# Patient Record
Sex: Male | Born: 1965 | Race: White | Hispanic: No | Smoking: Current some day smoker
Health system: Southern US, Community
[De-identification: ages and names within clinical notes are randomized; demographics above are authoritative.]

## PROBLEM LIST (undated history)

## (undated) DIAGNOSIS — R001 Bradycardia, unspecified: Secondary | ICD-10-CM

## (undated) DIAGNOSIS — E079 Disorder of thyroid, unspecified: Secondary | ICD-10-CM

## (undated) DIAGNOSIS — E039 Hypothyroidism, unspecified: Secondary | ICD-10-CM

## (undated) HISTORY — PX: CHEST TUBE INSERTION: SHX231

---

## 2001-05-20 ENCOUNTER — Emergency Department (HOSPITAL_COMMUNITY): Admission: EM | Admit: 2001-05-20 | Discharge: 2001-05-20 | Payer: Self-pay | Admitting: *Deleted

## 2001-05-20 ENCOUNTER — Encounter: Payer: Self-pay | Admitting: Emergency Medicine

## 2001-07-13 ENCOUNTER — Encounter: Payer: Self-pay | Admitting: *Deleted

## 2001-07-13 ENCOUNTER — Inpatient Hospital Stay (HOSPITAL_COMMUNITY): Admission: EM | Admit: 2001-07-13 | Discharge: 2001-07-22 | Payer: Self-pay | Admitting: Emergency Medicine

## 2001-07-15 ENCOUNTER — Encounter: Payer: Self-pay | Admitting: General Surgery

## 2001-07-16 ENCOUNTER — Encounter: Payer: Self-pay | Admitting: General Surgery

## 2001-07-17 ENCOUNTER — Encounter: Payer: Self-pay | Admitting: Family Medicine

## 2001-07-18 ENCOUNTER — Encounter: Payer: Self-pay | Admitting: General Surgery

## 2001-07-19 ENCOUNTER — Encounter: Payer: Self-pay | Admitting: General Surgery

## 2001-07-20 ENCOUNTER — Encounter: Payer: Self-pay | Admitting: General Surgery

## 2001-07-22 ENCOUNTER — Encounter: Payer: Self-pay | Admitting: General Surgery

## 2001-08-05 ENCOUNTER — Encounter: Payer: Self-pay | Admitting: General Surgery

## 2001-08-05 ENCOUNTER — Ambulatory Visit (HOSPITAL_COMMUNITY): Admission: RE | Admit: 2001-08-05 | Discharge: 2001-08-05 | Payer: Self-pay | Admitting: General Surgery

## 2001-08-16 ENCOUNTER — Emergency Department (HOSPITAL_COMMUNITY): Admission: EM | Admit: 2001-08-16 | Discharge: 2001-08-16 | Payer: Self-pay | Admitting: Emergency Medicine

## 2001-08-16 ENCOUNTER — Encounter: Payer: Self-pay | Admitting: Emergency Medicine

## 2001-10-05 ENCOUNTER — Encounter: Payer: Self-pay | Admitting: Family Medicine

## 2001-10-05 ENCOUNTER — Encounter: Payer: Self-pay | Admitting: Surgery

## 2001-10-05 ENCOUNTER — Inpatient Hospital Stay (HOSPITAL_COMMUNITY): Admission: EM | Admit: 2001-10-05 | Discharge: 2001-10-10 | Payer: Self-pay | Admitting: Emergency Medicine

## 2001-10-06 ENCOUNTER — Encounter: Payer: Self-pay | Admitting: Surgery

## 2001-10-07 ENCOUNTER — Encounter: Payer: Self-pay | Admitting: Surgery

## 2001-10-08 ENCOUNTER — Encounter: Payer: Self-pay | Admitting: Surgery

## 2001-10-09 ENCOUNTER — Encounter: Payer: Self-pay | Admitting: Surgery

## 2001-10-10 ENCOUNTER — Encounter: Payer: Self-pay | Admitting: Surgery

## 2001-10-27 ENCOUNTER — Encounter: Payer: Self-pay | Admitting: Surgery

## 2001-10-27 ENCOUNTER — Encounter: Admission: RE | Admit: 2001-10-27 | Discharge: 2001-10-27 | Payer: Self-pay | Admitting: Surgery

## 2001-12-19 ENCOUNTER — Encounter: Payer: Self-pay | Admitting: Emergency Medicine

## 2001-12-20 ENCOUNTER — Inpatient Hospital Stay (HOSPITAL_COMMUNITY): Admission: EM | Admit: 2001-12-20 | Discharge: 2001-12-30 | Payer: Self-pay

## 2001-12-20 ENCOUNTER — Encounter: Payer: Self-pay | Admitting: Surgery

## 2001-12-20 ENCOUNTER — Encounter: Payer: Self-pay | Admitting: Emergency Medicine

## 2001-12-21 ENCOUNTER — Encounter: Payer: Self-pay | Admitting: Surgery

## 2001-12-23 ENCOUNTER — Encounter: Payer: Self-pay | Admitting: Surgery

## 2001-12-24 ENCOUNTER — Encounter: Payer: Self-pay | Admitting: Surgery

## 2001-12-25 ENCOUNTER — Encounter: Payer: Self-pay | Admitting: Surgery

## 2001-12-26 ENCOUNTER — Encounter: Payer: Self-pay | Admitting: Cardiothoracic Surgery

## 2001-12-27 ENCOUNTER — Encounter: Payer: Self-pay | Admitting: Cardiothoracic Surgery

## 2001-12-29 ENCOUNTER — Encounter: Payer: Self-pay | Admitting: Surgery

## 2001-12-30 ENCOUNTER — Encounter: Payer: Self-pay | Admitting: Surgery

## 2001-12-30 ENCOUNTER — Encounter: Payer: Self-pay | Admitting: Thoracic Surgery (Cardiothoracic Vascular Surgery)

## 2002-01-12 ENCOUNTER — Encounter: Payer: Self-pay | Admitting: Surgery

## 2002-01-12 ENCOUNTER — Encounter: Admission: RE | Admit: 2002-01-12 | Discharge: 2002-01-12 | Payer: Self-pay | Admitting: Surgery

## 2006-10-17 ENCOUNTER — Emergency Department (HOSPITAL_COMMUNITY): Admission: EM | Admit: 2006-10-17 | Discharge: 2006-10-18 | Payer: Self-pay | Admitting: Emergency Medicine

## 2012-05-17 ENCOUNTER — Encounter (HOSPITAL_COMMUNITY): Payer: Self-pay | Admitting: *Deleted

## 2012-05-17 ENCOUNTER — Emergency Department (HOSPITAL_COMMUNITY)
Admission: EM | Admit: 2012-05-17 | Discharge: 2012-05-17 | Disposition: A | Discharge status: Home / Self Care | Payer: BC Managed Care – PPO | Attending: Emergency Medicine | Admitting: Emergency Medicine

## 2012-05-17 ENCOUNTER — Other Ambulatory Visit: Payer: Self-pay

## 2012-05-17 DIAGNOSIS — E876 Hypokalemia: Secondary | ICD-10-CM | POA: Insufficient documentation

## 2012-05-17 DIAGNOSIS — Z8679 Personal history of other diseases of the circulatory system: Secondary | ICD-10-CM | POA: Insufficient documentation

## 2012-05-17 DIAGNOSIS — Z87891 Personal history of nicotine dependence: Secondary | ICD-10-CM | POA: Insufficient documentation

## 2012-05-17 DIAGNOSIS — F1210 Cannabis abuse, uncomplicated: Secondary | ICD-10-CM | POA: Insufficient documentation

## 2012-05-17 DIAGNOSIS — R55 Syncope and collapse: Secondary | ICD-10-CM | POA: Insufficient documentation

## 2012-05-17 DIAGNOSIS — E860 Dehydration: Secondary | ICD-10-CM | POA: Insufficient documentation

## 2012-05-17 DIAGNOSIS — Z79899 Other long term (current) drug therapy: Secondary | ICD-10-CM | POA: Insufficient documentation

## 2012-05-17 DIAGNOSIS — E079 Disorder of thyroid, unspecified: Secondary | ICD-10-CM | POA: Insufficient documentation

## 2012-05-17 DIAGNOSIS — R824 Acetonuria: Secondary | ICD-10-CM | POA: Insufficient documentation

## 2012-05-17 DIAGNOSIS — I951 Orthostatic hypotension: Secondary | ICD-10-CM

## 2012-05-17 HISTORY — DX: Disorder of thyroid, unspecified: E07.9

## 2012-05-17 HISTORY — DX: Bradycardia, unspecified: R00.1

## 2012-05-17 LAB — URINALYSIS, ROUTINE W REFLEX MICROSCOPIC
Glucose, UA: NEGATIVE mg/dL
Hgb urine dipstick: NEGATIVE
Ketones, ur: NEGATIVE mg/dL
Leukocytes, UA: NEGATIVE
Protein, ur: NEGATIVE mg/dL
pH: 6 (ref 5.0–8.0)

## 2012-05-17 LAB — COMPREHENSIVE METABOLIC PANEL
ALT: 14 U/L (ref 0–53)
AST: 15 U/L (ref 0–37)
Albumin: 3.5 g/dL (ref 3.5–5.2)
Alkaline Phosphatase: 79 U/L (ref 39–117)
Chloride: 104 mEq/L (ref 96–112)
Potassium: 3.3 mEq/L — ABNORMAL LOW (ref 3.5–5.1)
Sodium: 139 mEq/L (ref 135–145)
Total Bilirubin: 0.2 mg/dL — ABNORMAL LOW (ref 0.3–1.2)
Total Protein: 6.6 g/dL (ref 6.0–8.3)

## 2012-05-17 LAB — CBC WITH DIFFERENTIAL/PLATELET
Basophils Absolute: 0 10*3/uL (ref 0.0–0.1)
Basophils Relative: 0 % (ref 0–1)
Eosinophils Absolute: 0.1 10*3/uL (ref 0.0–0.7)
Hemoglobin: 15.4 g/dL (ref 13.0–17.0)
MCH: 32.6 pg (ref 26.0–34.0)
MCHC: 35.2 g/dL (ref 30.0–36.0)
Monocytes Relative: 5 % (ref 3–12)
Neutro Abs: 8.6 10*3/uL — ABNORMAL HIGH (ref 1.7–7.7)
Neutrophils Relative %: 61 % (ref 43–77)
Platelets: 200 10*3/uL (ref 150–400)
RDW: 12.9 % (ref 11.5–15.5)

## 2012-05-17 MED ORDER — SODIUM CHLORIDE 0.9 % IV BOLUS (SEPSIS)
1000.0000 mL | Freq: Once | INTRAVENOUS | Status: AC
  Administered 2012-05-17: 1000 mL via INTRAVENOUS

## 2012-05-17 MED ORDER — POTASSIUM CHLORIDE ER 10 MEQ PO TBCR: 10.0000 meq | EXTENDED_RELEASE_TABLET | Freq: Two times a day (BID) | ORAL | Status: DC

## 2012-05-17 MED ORDER — ONDANSETRON 4 MG PO TBDP: 4.0000 mg | ORAL_TABLET | Freq: Three times a day (TID) | ORAL | Status: DC | PRN

## 2012-05-17 NOTE — ED Notes (Signed)
Pt states he was using the restroom at home standing to urinate and passed out. Pt states he had no pain or other sx prior to passing out but was found on the floor by his wife.

## 2012-05-17 NOTE — ED Provider Notes (Addendum)
History     CSN: [id number]  Arrival date & time 05/17/12  0048   First MD Initiated Contact with Patient 05/17/12 0106      Chief Complaint  Patient presents with  . Loss of Consciousness    (Consider location/radiation/quality/duration/timing/severity/associated sxs/prior treatment) HPI Comments: Pt presents with syncope - states that he was in the bathroom urinating and the next thing he knows he was on the floor looking up at his spouse who found him passed out on the bathroom floor.  He does not remember passing out, he states that he does remember feeling nauseated prior to this happening. He denies any prodromal palpitations, chest pain, shortness of breath, headache, blurred vision, lightheadedness, swelling, rashes. He has not had diarrhea, rectal bleeding, fevers or chills. He states that he does have thyroid disease for which he takes a medication but does not have any history of cardiac disease, pulmonary disease or neurogenic disease.  Patient is a 46 y.o. male presenting with syncope. The history is provided by the patient and a relative.  Loss of Consciousness    Past Medical History  Diagnosis Date  . Thyroid disease   . Bradycardia     Past Surgical History  Procedure Date  . Chest tube insertion     History reviewed. No pertinent family history.  History  Substance Use Topics  . Smoking status: Former Games developer  . Smokeless tobacco: Not on file  . Alcohol Use: No      Review of Systems  Cardiovascular: Positive for syncope.  All other systems reviewed and are negative.    Allergies  Review of patient's allergies indicates no known allergies.  Home Medications   Current Outpatient Rx  Name  Route  Sig  Dispense  Refill  . LEVOTHYROXINE SODIUM 125 MCG PO TABS   Oral   Take 125 mcg by mouth daily.         Marland Kitchen ONDANSETRON 4 MG PO TBDP   Oral   Take 1 tablet (4 mg total) by mouth every 8 (eight) hours as needed for nausea.   10 tablet   0     . POTASSIUM CHLORIDE ER 10 MEQ PO TBCR   Oral   Take 1 tablet (10 mEq total) by mouth 2 (two) times daily.   10 tablet   0     BP 111/54  Pulse 61  Temp 98.3 F (36.8 C) (Oral)  Resp 16  Ht 5\' 10"  (1.778 m)  Wt 140 lb (63.504 kg)  BMI 20.09 kg/m2  SpO2 93%  Physical Exam  Nursing note and vitals reviewed. Constitutional: He appears well-developed and well-nourished. No distress.  HENT:  Head: Normocephalic and atraumatic.  Mouth/Throat: Oropharynx is clear and moist. No oropharyngeal exudate.  Eyes: Conjunctivae normal and EOM are normal. Pupils are equal, round, and reactive to light. Right eye exhibits no discharge. Left eye exhibits no discharge. No scleral icterus.  Neck: Normal range of motion. Neck supple. No JVD present. No thyromegaly present.  Cardiovascular: Normal rate, regular rhythm, normal heart sounds and intact distal pulses.  Exam reveals no gallop and no friction rub.   No murmur heard. Pulmonary/Chest: Effort normal and breath sounds normal. No respiratory distress. He has no wheezes. He has no rales.  Abdominal: Soft. Bowel sounds are normal. He exhibits no distension and no mass. There is no tenderness.  Musculoskeletal: Normal range of motion. He exhibits no edema and no tenderness.  Lymphadenopathy:    He has no cervical  adenopathy.  Neurological: He is alert. Coordination normal.       Neurologic exam:  Speech clear, pupils equal round reactive to light, extraocular movements intact  Normal peripheral visual fields Cranial nerves III through XII normal including no facial droop Follows commands, moves all extremities x4, normal strength to bilateral upper and lower extremities at all major muscle groups including grip Sensation normal to light touch and pinprick Coordination intact, no limb ataxia, finger-nose-finger normal Rapid alternating movements normal No pronator drift Gait normal   Skin: Skin is warm and dry. No rash noted. No erythema.   Psychiatric: He has a normal mood and affect. His behavior is normal.    ED Course  Procedures (including critical care time)  Labs Reviewed  CBC WITH DIFFERENTIAL - Abnormal; Notable for the following:    WBC 14.3 (*)     Neutro Abs 8.6 (*)     Lymphs Abs 4.8 (*)     All other components within normal limits  COMPREHENSIVE METABOLIC PANEL - Abnormal; Notable for the following:    Potassium 3.3 (*)     Total Bilirubin 0.2 (*)     GFR calc non Af Amer 81 (*)     All other components within normal limits  URINALYSIS, ROUTINE W REFLEX MICROSCOPIC - Abnormal; Notable for the following:    Specific Gravity, Urine >1.030 (*)     All other components within normal limits  TROPONIN I   No results found.   1. Orthostatic syncope   2. Hypokalemia   3. Dehydration       MDM  Mental status is normal - neuro and cardiac exams are unremarkable, ? Micturition syncope, orthostatics.  Exam reassuring.   The patient has been reevaluated, he has significant lightheadedness when he stands up from a sitting or laying position. He does have a slight drop in his blood pressure and a slight raising his heart rate when he sits up. He has mild ketonuria and mild hypokalemia. I suspect that the majority of the symptoms are from relative dehydration, I have asked him to followup with his family Dr. For further testing regarding his mild sinus bradycardia and have given you potassium supplementation for home. Fluids and encouraged, IV fluid bolus has been given in emergency department, the patient appears stable for discharge   ED ECG REPORT  I personally interpreted this EKG   Date: 05/17/2012   Rate: 50  Rhythm: sinus bradycardia  QRS Axis: normal  Intervals: QRS slightly prolonged  ST/T Wave abnormalities: normal  Conduction Disutrbances:none  Narrative Interpretation:   Old EKG Reviewed: none available    Vida Roller, MD 05/17/12 [id number]  Vida Roller, MD 05/17/12 0700

## 2012-05-17 NOTE — ED Notes (Signed)
Pt discharged. Pt stable at time of discharge. Medications reviewed pt has no questions regarding discharge at this time. Pt voiced understanding of discharge instructions.  

## 2014-07-05 ENCOUNTER — Other Ambulatory Visit (HOSPITAL_COMMUNITY): Payer: Self-pay | Admitting: Physician Assistant

## 2014-07-05 DIAGNOSIS — R519 Headache, unspecified: Secondary | ICD-10-CM

## 2014-07-05 DIAGNOSIS — R51 Headache: Principal | ICD-10-CM

## 2014-07-08 ENCOUNTER — Ambulatory Visit (HOSPITAL_COMMUNITY): Payer: BLUE CROSS/BLUE SHIELD

## 2014-07-14 ENCOUNTER — Ambulatory Visit (HOSPITAL_COMMUNITY)
Admission: RE | Admit: 2014-07-14 | Discharge: 2014-07-14 | Disposition: A | Discharge status: Home / Self Care | Payer: BLUE CROSS/BLUE SHIELD | Source: Ambulatory Visit | Attending: Physician Assistant | Admitting: Physician Assistant

## 2014-07-14 ENCOUNTER — Other Ambulatory Visit (HOSPITAL_COMMUNITY): Payer: Self-pay | Admitting: Physician Assistant

## 2014-07-14 DIAGNOSIS — R51 Headache: Secondary | ICD-10-CM | POA: Diagnosis not present

## 2014-07-14 DIAGNOSIS — Z01818 Encounter for other preprocedural examination: Secondary | ICD-10-CM

## 2014-07-14 DIAGNOSIS — R519 Headache, unspecified: Secondary | ICD-10-CM

## 2015-12-27 ENCOUNTER — Encounter (INDEPENDENT_AMBULATORY_CARE_PROVIDER_SITE_OTHER): Payer: Self-pay | Admitting: *Deleted

## 2016-11-03 ENCOUNTER — Emergency Department (HOSPITAL_COMMUNITY): Payer: PRIVATE HEALTH INSURANCE

## 2016-11-03 ENCOUNTER — Emergency Department (HOSPITAL_COMMUNITY)
Admission: EM | Admit: 2016-11-03 | Discharge: 2016-11-04 | Disposition: A | Discharge status: Home / Self Care | Payer: PRIVATE HEALTH INSURANCE | Attending: Emergency Medicine | Admitting: Emergency Medicine

## 2016-11-03 DIAGNOSIS — Z87891 Personal history of nicotine dependence: Secondary | ICD-10-CM | POA: Insufficient documentation

## 2016-11-03 DIAGNOSIS — R911 Solitary pulmonary nodule: Secondary | ICD-10-CM | POA: Diagnosis not present

## 2016-11-03 DIAGNOSIS — Z79899 Other long term (current) drug therapy: Secondary | ICD-10-CM | POA: Insufficient documentation

## 2016-11-03 DIAGNOSIS — N23 Unspecified renal colic: Secondary | ICD-10-CM | POA: Insufficient documentation

## 2016-11-03 DIAGNOSIS — E876 Hypokalemia: Secondary | ICD-10-CM | POA: Insufficient documentation

## 2016-11-03 DIAGNOSIS — R109 Unspecified abdominal pain: Secondary | ICD-10-CM | POA: Insufficient documentation

## 2016-11-03 DIAGNOSIS — R112 Nausea with vomiting, unspecified: Secondary | ICD-10-CM | POA: Diagnosis present

## 2016-11-03 DIAGNOSIS — N50811 Right testicular pain: Secondary | ICD-10-CM | POA: Insufficient documentation

## 2016-11-03 LAB — CBC WITH DIFFERENTIAL/PLATELET
BASOS PCT: 0 %
Basophils Absolute: 0 10*3/uL (ref 0.0–0.1)
EOS ABS: 0.2 10*3/uL (ref 0.0–0.7)
Eosinophils Relative: 1 %
HCT: 45.8 % (ref 39.0–52.0)
HEMOGLOBIN: 15.7 g/dL (ref 13.0–17.0)
Lymphocytes Relative: 25 %
Lymphs Abs: 3.5 10*3/uL (ref 0.7–4.0)
MCH: 31.2 pg (ref 26.0–34.0)
MCHC: 34.3 g/dL (ref 30.0–36.0)
MCV: 91.1 fL (ref 78.0–100.0)
MONOS PCT: 5 %
Monocytes Absolute: 0.7 10*3/uL (ref 0.1–1.0)
NEUTROS PCT: 69 %
Neutro Abs: 9.5 10*3/uL — ABNORMAL HIGH (ref 1.7–7.7)
PLATELETS: 211 10*3/uL (ref 150–400)
RBC: 5.03 MIL/uL (ref 4.22–5.81)
RDW: 13.2 % (ref 11.5–15.5)
WBC: 13.8 10*3/uL — AB (ref 4.0–10.5)

## 2016-11-03 LAB — URINALYSIS, ROUTINE W REFLEX MICROSCOPIC
BILIRUBIN URINE: NEGATIVE
GLUCOSE, UA: NEGATIVE mg/dL
KETONES UR: NEGATIVE mg/dL
Leukocytes, UA: NEGATIVE
Nitrite: NEGATIVE
PH: 6 (ref 5.0–8.0)
Protein, ur: NEGATIVE mg/dL
SPECIFIC GRAVITY, URINE: 1.014 (ref 1.005–1.030)
SQUAMOUS EPITHELIAL / LPF: NONE SEEN

## 2016-11-03 LAB — BASIC METABOLIC PANEL
Anion gap: 10 (ref 5–15)
BUN: 22 mg/dL — ABNORMAL HIGH (ref 6–20)
CALCIUM: 9.4 mg/dL (ref 8.9–10.3)
CO2: 23 mmol/L (ref 22–32)
CREATININE: 1.28 mg/dL — AB (ref 0.61–1.24)
Chloride: 109 mmol/L (ref 101–111)
GFR calc non Af Amer: >60 mL/min (ref >60)
Glucose, Bld: 130 mg/dL — ABNORMAL HIGH (ref 65–99)
Potassium: 3.2 mmol/L — ABNORMAL LOW (ref 3.5–5.1)
SODIUM: 142 mmol/L (ref 135–145)

## 2016-11-03 MED ORDER — ONDANSETRON HCL 4 MG PO TABS: 4.0000 mg | ORAL_TABLET | Freq: Four times a day (QID) | ORAL | 0 refills | Status: DC | PRN

## 2016-11-03 MED ORDER — HYDROMORPHONE HCL 1 MG/ML IJ SOLN
1.0000 mg | Freq: Once | INTRAMUSCULAR | Status: AC
  Administered 2016-11-03: 1 mg via INTRAVENOUS
  Filled 2016-11-03: qty 1

## 2016-11-03 MED ORDER — OXYCODONE-ACETAMINOPHEN 5-325 MG PO TABS: 1.0000 | ORAL_TABLET | ORAL | 0 refills | Status: DC | PRN

## 2016-11-03 MED ORDER — KETOROLAC TROMETHAMINE 10 MG PO TABS: 10.0000 mg | ORAL_TABLET | Freq: Four times a day (QID) | ORAL | 0 refills | Status: DC | PRN

## 2016-11-03 MED ORDER — METOCLOPRAMIDE HCL 5 MG/ML IJ SOLN
10.0000 mg | Freq: Once | INTRAMUSCULAR | Status: AC
  Administered 2016-11-03: 10 mg via INTRAVENOUS
  Filled 2016-11-03: qty 2

## 2016-11-03 MED ORDER — POTASSIUM CHLORIDE CRYS ER 20 MEQ PO TBCR
40.0000 meq | EXTENDED_RELEASE_TABLET | Freq: Once | ORAL | Status: AC
Start: 2016-11-03 — End: 2016-11-03
  Administered 2016-11-03: 40 meq via ORAL
  Filled 2016-11-03: qty 2

## 2016-11-03 MED ORDER — POTASSIUM CHLORIDE CRYS ER 20 MEQ PO TBCR: 20.0000 meq | EXTENDED_RELEASE_TABLET | Freq: Every day | ORAL | 0 refills | Status: DC

## 2016-11-03 MED ORDER — TAMSULOSIN HCL 0.4 MG PO CAPS: 0.4000 mg | ORAL_CAPSULE | Freq: Every day | ORAL | 0 refills | Status: DC

## 2016-11-03 NOTE — ED Triage Notes (Signed)
Pt reports having pain in his right testicle on Thursday. Pt states it radiates up into his abdomen and right flank. Pt also reporting a headache with n/v.

## 2016-11-03 NOTE — ED Triage Notes (Signed)
Pt presents with right groin pain x 3 days, denies hx of kidney stones.  Also c/o HA today.

## 2016-11-03 NOTE — ED Provider Notes (Signed)
CT with evidence of 5 mm ureteral stone on the right. Hydronephrosis is present. Incidental findings of lung nodules   Patient is symptom free and resting. Abdominal exam is benign. Discuss CT results. The symptoms persist patient will need to follow-up with urology. Patient will also need to follow-up with his primary physician to arrange repeat CT scan in 6-12 months. Return precautions given.   Loren RacerYelverton, Fergie Sherbert, MD 11/03/16 [phone]

## 2016-11-03 NOTE — ED Provider Notes (Signed)
[facility]-EMERGENCY DEPT Provider Note   CSN: [id number] Arrival date & time: 11/03/16  2001     [id number]   Chief Complaint Chief Complaint  Patient presents with  . Testicle Pain    HPI Timothy Irwin is a 51 y.o. male.  HPI Complains of right testicular pain radiating to right groin and right flank onset 3 or 4 days ago, constant. He also complains of gradual onset diffuse headache which started approximately 2 hours ago accompanied by multiple episodes of vomiting. No fever. No treatment prior to coming here. Nothing makes symptoms better or worse. No other associated symptoms Past Medical History:  Diagnosis Date  . Bradycardia   . Thyroid disease     There are no active problems to display for this patient.   Past Surgical History:  Procedure Laterality Date  . CHEST TUBE INSERTION         Home Medications    Prior to Admission medications   Medication Sig Start Date End Date Taking? Authorizing Provider  levothyroxine (SYNTHROID, LEVOTHROID) 125 MCG tablet Take 125 mcg by mouth daily.    [provider]  ondansetron (ZOFRAN ODT) 4 MG disintegrating tablet Take 1 tablet (4 mg total) by mouth every 8 (eight) hours as needed for nausea. 05/17/12   Eber HongMiller, Brian, MD  potassium chloride (K-DUR) 10 MEQ tablet Take 1 tablet (10 mEq total) by mouth 2 (two) times daily. 05/17/12   Eber HongMiller, Brian, MD    Family History No family history on file.  Social History Social History  Substance Use Topics  . Smoking status: Former Games developermoker  . Smokeless tobacco: Not on file  . Alcohol use No   Cigar smoker no alcohol no drug use  Allergies   Patient has no known allergies.   Review of Systems Review of Systems  Constitutional: Negative.   HENT: Negative.   Respiratory: Negative.   Cardiovascular: Negative.   Gastrointestinal: Positive for nausea and vomiting.  Genitourinary: Positive for flank pain and testicular pain.  Skin: Negative.   Neurological: Positive  for headaches.  Psychiatric/Behavioral: Negative.   All other systems reviewed and are negative.    Physical Exam Updated Vital Signs BP (!) 142/64 (BP Location: Left Arm)   Pulse (!) 54   Temp 98.4 F (36.9 C) (Temporal)   Resp 16   Ht 5\' 9"  (1.753 m)   Wt 135 lb (61.2 kg)   SpO2 100%   BMI 19.94 kg/m   Physical Exam  Constitutional: He is oriented to person, place, and time. He appears well-developed and well-nourished. He appears distressed.  Appears uncomfortable  HENT:  Head: Normocephalic and atraumatic.  Eyes: Conjunctivae are normal. Pupils are equal, round, and reactive to light.  Neck: Neck supple. No tracheal deviation present. No thyromegaly present.  Cardiovascular: Normal rate and regular rhythm.   No murmur heard. Pulmonary/Chest: Effort normal and breath sounds normal.  Abdominal: Soft. Bowel sounds are normal. He exhibits no distension. There is no tenderness.  Genitourinary: Penis normal.  Genitourinary Comments: Scrotum normal. Testes normal lie.scrotum nontender No flank tenderness  Musculoskeletal: Normal range of motion. He exhibits no edema or tenderness.  Neurological: He is alert and oriented to person, place, and time. No cranial nerve deficit. Coordination normal.  Moves all extremities motor strength 5 over 5 overall all extremities well  Skin: Skin is warm and dry. No rash noted.  Psychiatric: He has a normal mood and affect.  Nursing note and vitals reviewed.    ED Treatments /  Results  Labs (all labs ordered are listed, but only abnormal results are displayed) Labs Reviewed - No data to display  EKG  EKG Interpretation None       Radiology No results found.  Procedures Procedures (including critical care time)  Medications Ordered in ED Medications - No data to display  Results for orders placed or performed during the hospital encounter of 11/03/16  Basic metabolic panel  Result Value Ref Range   Sodium 142 135 - 145  mmol/L   Potassium 3.2 (L) 3.5 - 5.1 mmol/L   Chloride 109 101 - 111 mmol/L   CO2 23 22 - 32 mmol/L   Glucose, Bld 130 (H) 65 - 99 mg/dL   BUN 22 (H) 6 - 20 mg/dL   Creatinine, Ser 8.11 (H) 0.61 - 1.24 mg/dL   Calcium 9.4 8.9 - 91.4 mg/dL   GFR calc non Af Amer >60 >60 mL/min   GFR calc Af Amer >60 >60 mL/min   Anion gap 10 5 - 15  CBC with Differential/Platelet  Result Value Ref Range   WBC 13.8 (H) 4.0 - 10.5 K/uL   RBC 5.03 4.22 - 5.81 MIL/uL   Hemoglobin 15.7 13.0 - 17.0 g/dL   HCT 78.2 95.6 - 21.3 %   MCV 91.1 78.0 - 100.0 fL   MCH 31.2 26.0 - 34.0 pg   MCHC 34.3 30.0 - 36.0 g/dL   RDW 08.6 57.8 - 46.9 %   Platelets 211 150 - 400 K/uL   Neutrophils Relative % 69 %   Neutro Abs 9.5 (H) 1.7 - 7.7 K/uL   Lymphocytes Relative 25 %   Lymphs Abs 3.5 0.7 - 4.0 K/uL   Monocytes Relative 5 %   Monocytes Absolute 0.7 0.1 - 1.0 K/uL   Eosinophils Relative 1 %   Eosinophils Absolute 0.2 0.0 - 0.7 K/uL   Basophils Relative 0 %   Basophils Absolute 0.0 0.0 - 0.1 K/uL   No results found. Initial Impression / Assessment and Plan / ED Course  I have reviewed the triage vital signs and the nursing notes.  Pertinent labs & imaging results that were available during my care of the patient were reviewed by me and considered in my medical decision making (see chart for details).     10 PM patient feels much improved after treatment with intravenous hydromorphone and Reglan. He is alert Glasgow Coma Score score 15 no distress Signed out to Dr. Ranae Palms 10 PM. Oral Potassium chloride ordered. Suspect ureteral colic Final Clinical Impressions(s) / ED Diagnoses  Diagnosis #1 right testicular pain  #2 right flank pain #3 headache #4 nausea and vomiting Final diagnoses:  None  #5 hypokalemia  New Prescriptions New Prescriptions   No medications on file     Doug Sou, MD 11/03/16 2205

## 2018-06-03 IMAGING — CT CT RENAL STONE PROTOCOL
2 of 4 series · 16 of 46 positions shown, 18 images · non-contrast
Comparison: None.

CLINICAL DATA: Right flank pain.

EXAM:
CT ABDOMEN AND PELVIS WITHOUT CONTRAST
TECHNIQUE: Multidetector CT imaging of the abdomen and pelvis was performed
following the standard protocol without IV contrast.

[Series 2: axial st · axial · 0.66mm/px · z∈[-64,+316]mm · 13 of 84 slices shown, 15 images]
[im 4/84  soft-tissue]
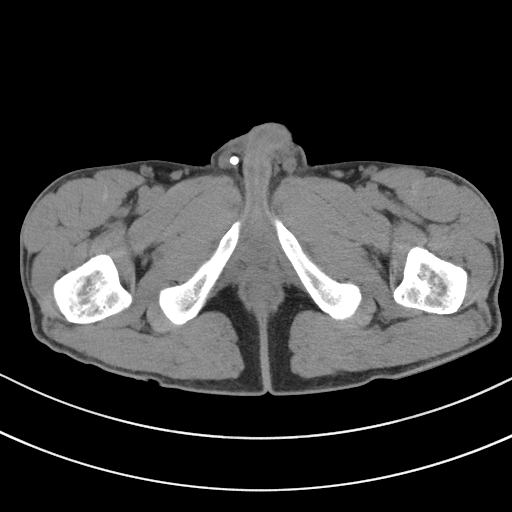
[im 4/84  bone]
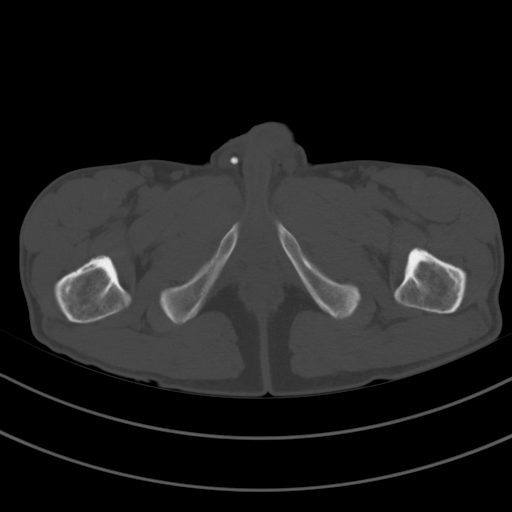
[im 11/84  soft-tissue]
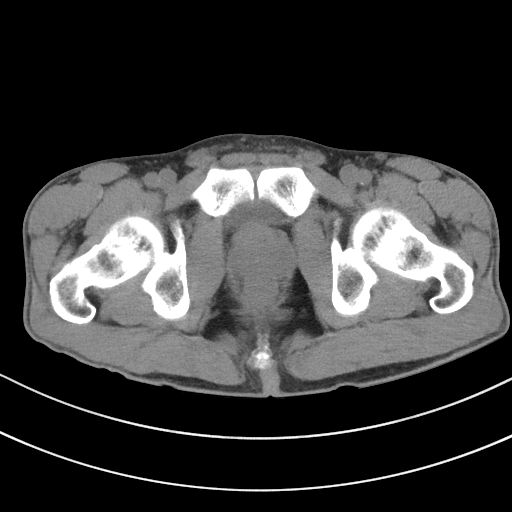
[im 18/84  soft-tissue]
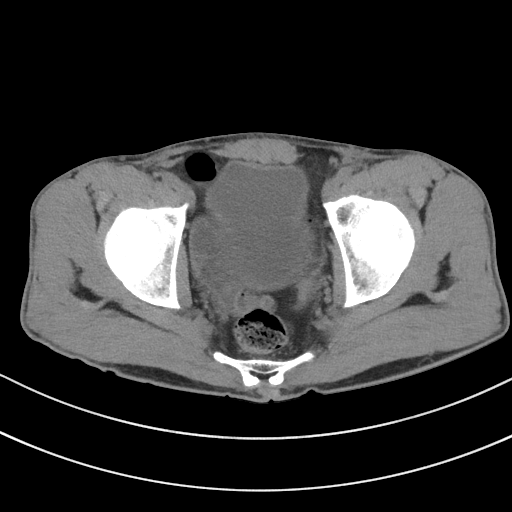
[im 25/84  soft-tissue]
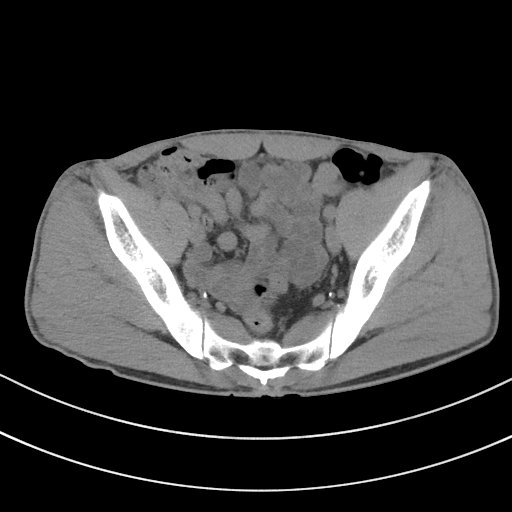
[im 28/84  soft-tissue]
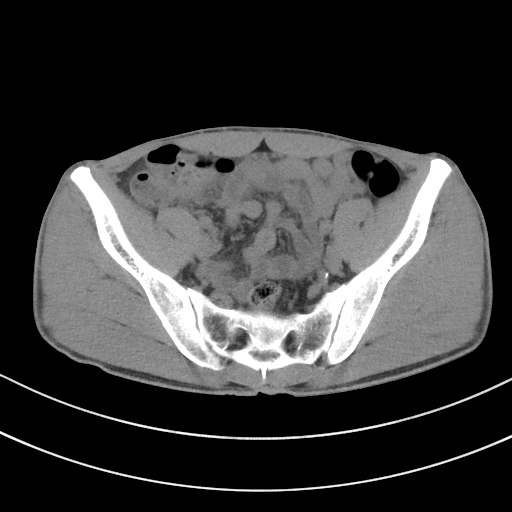
[im 35/84  soft-tissue]
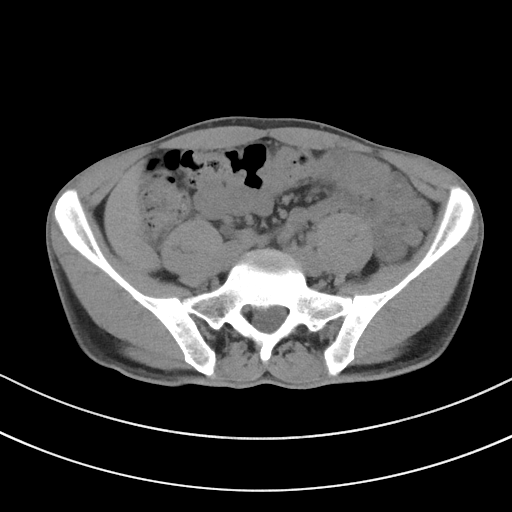
[im 42/84  soft-tissue]
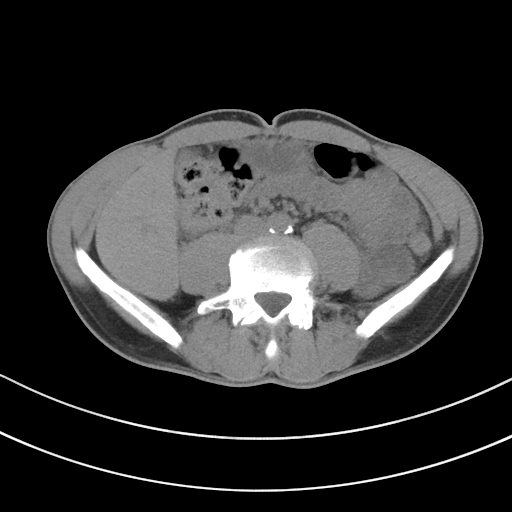
[im 49/84  soft-tissue]
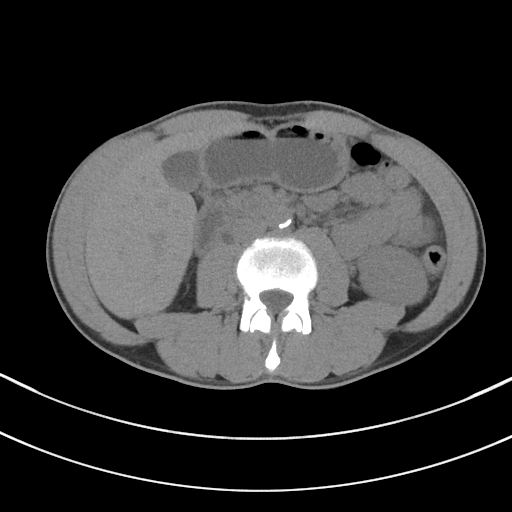
[im 56/84  soft-tissue]
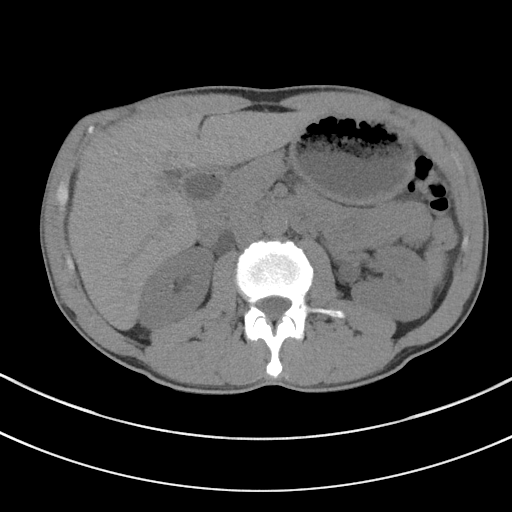
[im 56/84  bone]
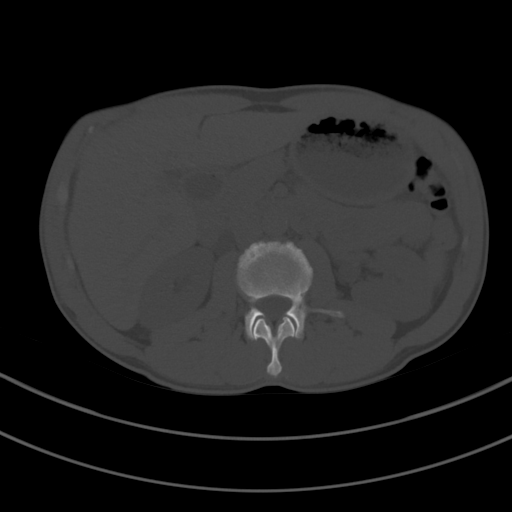
[im 59/84  soft-tissue]
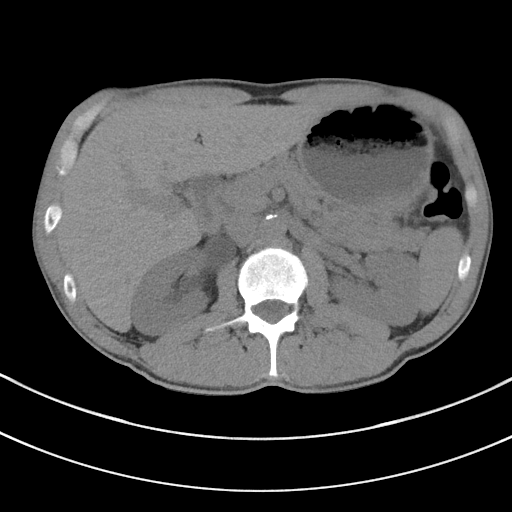
[im 66/84  soft-tissue]
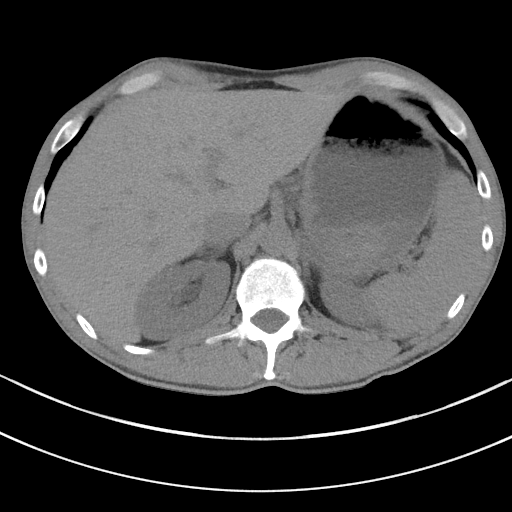
[im 73/84  soft-tissue]
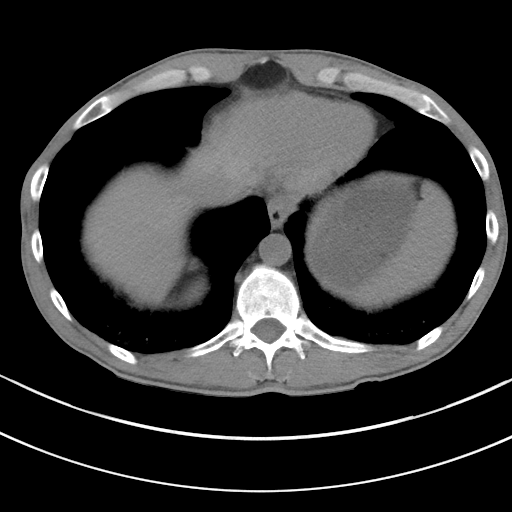
[im 80/84  soft-tissue]
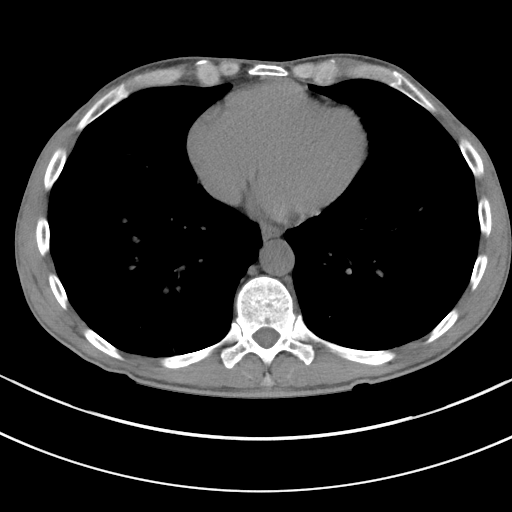

[Series 5: coronal st · coronal · 0.71mm/px · 3 of 84 slices shown]
[im 28/84  soft-tissue]
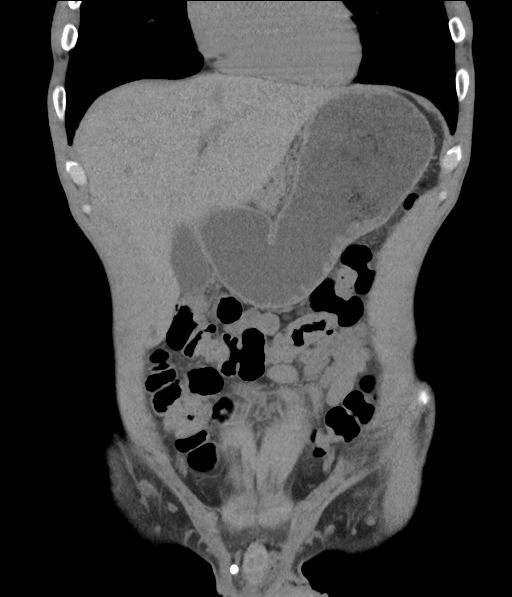
[im 37/84  soft-tissue]
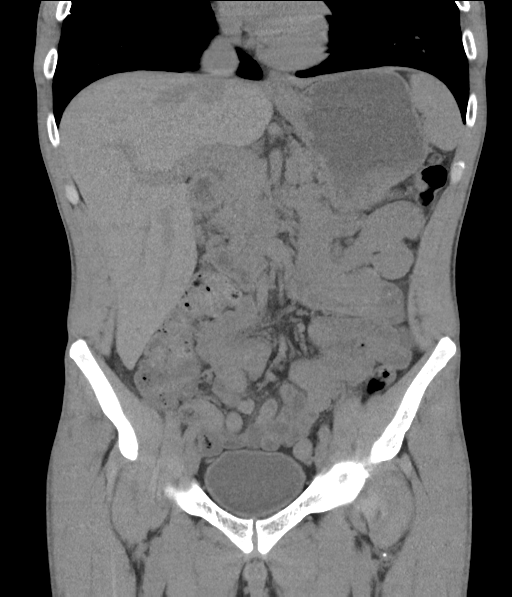
[im 47/84  soft-tissue]
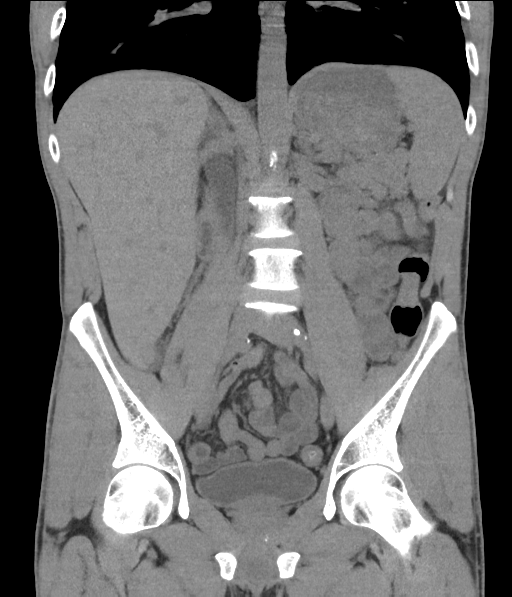

[16 of 46 positions shown; findings below may reference images not displayed]

FINDINGS: Lower chest: There is a 4 mm nodule in the lateral right lung base
on series 4, image 15. There is a 5.3 mm nodule on image 8
emphysematous changes are seen in the lung bases.

Hepatobiliary: No focal liver abnormality is seen. No gallstones,
gallbladder wall thickening, or biliary dilatation.

Pancreas: Unremarkable. No pancreatic ductal dilatation or
surrounding inflammatory changes.

Spleen: Normal in size without focal abnormality.

Adrenals/Urinary Tract: There is a cyst in the anterior left kidney.
No suspicious masses seen in the kidneys. No renal stones seen in
the kidneys. No hydronephrosis on the left. No left ureteral stones.
There is mild hydronephrosis on the right due to a 5 mm stone in the
mid right ureter. The bladder is normal.

Stomach/Bowel: The stomach and small bowel are normal. The colon is
normal in appearance as is the visualized appendix.

Vascular/Lymphatic: Atherosclerosis is seen in the non aneurysmal
aorta. No adenopathy.

Reproductive: Prostate is unremarkable.

Other: No abdominal wall hernia or abnormality. No abdominopelvic
ascites.

Musculoskeletal: No acute or significant osseous findings.
IMPRESSION: 1. 5 mm stone in the mid right ureter resulting in mild
hydronephrosis.
2. Two nodules in the lung bases with the largest on the left
measuring 5.3 mm. Non-contrast chest CT at 6-12 months is
recommended. If the nodule is stable at time of repeat CT, then
future CT at 18-24 months (from today's scan) is considered optional
for low-risk patients, but is recommended for high-risk patients.
This recommendation follows the consensus statement: Guidelines for
Management of Incidental Pulmonary Nodules Detected on CT Images:
3. Atherosclerosis.

## 2018-11-24 ENCOUNTER — Other Ambulatory Visit: Payer: Self-pay | Admitting: Family Medicine

## 2018-11-24 ENCOUNTER — Other Ambulatory Visit (HOSPITAL_COMMUNITY): Payer: Self-pay | Admitting: Family Medicine

## 2018-11-24 DIAGNOSIS — R911 Solitary pulmonary nodule: Secondary | ICD-10-CM

## 2021-03-26 ENCOUNTER — Encounter: Payer: Self-pay | Admitting: *Deleted

## 2021-03-29 ENCOUNTER — Encounter: Payer: Self-pay | Admitting: Urology

## 2021-08-27 ENCOUNTER — Encounter (INDEPENDENT_AMBULATORY_CARE_PROVIDER_SITE_OTHER): Payer: Self-pay | Admitting: *Deleted

## 2021-11-05 ENCOUNTER — Encounter (INDEPENDENT_AMBULATORY_CARE_PROVIDER_SITE_OTHER): Payer: Self-pay | Admitting: Gastroenterology

## 2021-11-05 ENCOUNTER — Ambulatory Visit (INDEPENDENT_AMBULATORY_CARE_PROVIDER_SITE_OTHER): Payer: PRIVATE HEALTH INSURANCE | Admitting: Gastroenterology

## 2021-11-05 VITALS — BP 102/67 | HR 63 | Temp 98.1°F | Ht 69.0 in | Wt 130.0 lb

## 2021-11-05 DIAGNOSIS — R1013 Epigastric pain: Secondary | ICD-10-CM | POA: Diagnosis not present

## 2021-11-05 NOTE — H&P (View-Only) (Signed)
 Referring Provider: Golding, John, MD Primary Care Physician:  Golding, John, MD Primary GI Physician: new  Chief Complaint  Patient presents with   Abdominal Pain    Upper mid abdominal pain for awhile. States he had ct scan and us. Had scans at [facility].    HPI:   Timothy Irwin is a 55 y.o. male with past medical history of thyroid disease  Patient presenting today as a new patient for epigastric pain.  US abd complete 02/26/21 without findings to explain symptoms, CT A/P w contrast on 04/04/21 with no abnormalities, left renal cyst.  He reports that he has some upper abdominal pain for about the past 6 months, denies precipitating or alleviating factors.  Denies any vomiting. Has some occasional nausea due to pain being so severe at times. May notice the pain 3-4 days in a row, sometimes can go a week without the pain. Pain is dull in nature. Denies any pain radiation into his back or around right or left side. States that pain is better when he sits down. Reports appetite is good and he eats quite a bit, usually anything he wants. Has lost approx 30 pounds over the past few years though he went through a divorce and the loss of a child. Denies constipation or diarrhea. He does report that when the urge hits him to defecate, he has to get to a restroom pretty quickly, this is not new for him. Usually has a BM every morning between 5 and 530am. Denies rectal bleeding or melena. Reports very occasional issues with acid reflux. Denies dysphagia or odynophagia. Does not drink alcohol or take any OTC meds. No dysphagia or odynophagia.   NSAID use: no otc meds  Social hx: no etoh, smokes cigars Fam hx: adopted, unsure of family hx   Last Colonoscopy:never  Last Endoscopy:never  Past Medical History:  Diagnosis Date   Bradycardia    Thyroid disease     Past Surgical History:  Procedure Laterality Date   CHEST TUBE INSERTION      Current Outpatient Medications  Medication Sig  Dispense Refill   levothyroxine (SYNTHROID, LEVOTHROID) 125 MCG tablet Take 125 mcg by mouth daily before breakfast.      No current facility-administered medications for this visit.    Allergies as of 11/05/2021   (No Known Allergies)    History reviewed. No pertinent family history.  Social History   Socioeconomic History   Marital status: Divorced    Spouse name: Not on file   Number of children: Not on file   Years of education: Not on file   Highest education level: Not on file  Occupational History   Not on file  Tobacco Use   Smoking status: Some Days    Types: Cigars    Passive exposure: Current   Smokeless tobacco: Never  Substance and Sexual Activity   Alcohol use: No   Drug use: Yes    Types: Marijuana   Sexual activity: Not on file  Other Topics Concern   Not on file  Social History Narrative   Not on file   Social Determinants of Health   Financial Resource Strain: Not on file  Food Insecurity: Not on file  Transportation Needs: Not on file  Physical Activity: Not on file  Stress: Not on file  Social Connections: Not on file   Review of systems General: negative for malaise, night sweats, fever, chills, weight loss Neck: Negative for lumps, goiter, pain and significant neck swelling   Resp: Negative for cough, wheezing, dyspnea at rest CV: Negative for chest pain, leg swelling, palpitations, orthopnea GI: denies melena, hematochezia, nausea, vomiting, diarrhea, constipation, dysphagia, odyonophagia, early satiety or unintentional weight loss. +epigastric pain MSK: Negative for joint pain or swelling, back pain, and muscle pain. Derm: Negative for itching or rash Psych: Denies depression, anxiety, memory loss, confusion. No homicidal or suicidal ideation.  Heme: Negative for prolonged bleeding, bruising easily, and swollen nodes. Endocrine: Negative for cold or heat intolerance, polyuria, polydipsia and goiter. Neuro: negative for tremor, gait  imbalance, syncope and seizures. The remainder of the review of systems is noncontributory.  Physical Exam: BP 102/67 (BP Location: Left Arm, Patient Position: Sitting, Cuff Size: Normal)   Pulse 63   Temp 98.1 F (36.7 C) (Oral)   Ht 5' 9" (1.753 m)   Wt 130 lb (59 kg)   BMI 19.20 kg/m  General:   Alert and oriented. No distress noted. Pleasant and cooperative.  Head:  Normocephalic and atraumatic. Eyes:  Conjuctiva clear without scleral icterus. Mouth:  Oral mucosa pink and moist. Good dentition. No lesions. Heart: Normal rate and rhythm, s1 and s2 heart sounds present.  Lungs: Clear lung sounds in all lobes. Respirations equal and unlabored. Abdomen:  +BS, soft, non-tender and non-distended. No rebound or guarding. No HSM or masses noted. Derm: No palmar erythema or jaundice Msk:  Symmetrical without gross deformities. Normal posture. Extremities:  Without edema. Neurologic:  Alert and  oriented x4 Psych:  Alert and cooperative. Normal mood and affect.  Invalid input(s): 6 MONTHS   ASSESSMENT: Timothy Irwin is a 55 y.o. male presenting today as a new patient for epigastric pain.  Epigastric pain x6 months without precipitating or alleviating factors. Denies nausea, vomiting, early satiety, changes in appetite or associated weight loss. No radiation of pain. Denies GERD symptoms. US abd Complete and CT A/P previously without any findings to explain symptoms. No NSAID or ETOH use. Discussed proceeding with EGD for further evaluation as I cannot rule out PUD, gastritis, esophagitis or less likely, malignancy. Indications, risks and benefits of procedure discussed in detail with patient. Patient verbalized understanding and is in agreement to proceed with EGD at this time. Patient declined the need for anything for pain at this time. He will make me aware if he feels that he requires something, if so will send dicyclomine.   No previous colonoscopy, I discussed indications for CRC  screening via colonoscopy, starting at age 45 with the patient. He is of average risk for CRC. States he has done FIT test with PCP. Will think about colonoscopy and let me know if he wishes to proceed with this in the future.   PLAN:  Schedule EGD-endo 1 2.  Pt to consider CRC screening via colonoscopy  3. Will make me aware of new or worsening GI symptoms  All questions were answered, patient verbalized understanding and is in agreement with plan as outlined above.   Follow Up: 4 months  Mayvis Agudelo L. Mimie Goering, MSN, APRN, AGNP-C Adult-Gerontology Nurse Practitioner [facility]  

## 2021-11-05 NOTE — Progress Notes (Signed)
Referring Provider: Sharilyn Sites, MD Primary Care Physician:  Sharilyn Sites, MD Primary GI Physician: new  Chief Complaint  Patient presents with   Abdominal Pain    Upper mid abdominal pain for awhile. States he had ct scan and [place]. Had scans at [facility].    HPI:   AMY USHER is a 56 y.o. male with past medical history of thyroid disease  Patient presenting today as a new patient for epigastric pain.  [place] abd complete 02/26/21 without findings to explain symptoms, CT A/P w contrast on 04/04/21 with no abnormalities, left renal cyst.  He reports that he has some upper abdominal pain for about the past 6 months, denies precipitating or alleviating factors.  Denies any vomiting. Has some occasional nausea due to pain being so severe at times. May notice the pain 3-4 days in a row, sometimes can go a week without the pain. Pain is dull in nature. Denies any pain radiation into his back or around right or left side. States that pain is better when he sits down. Reports appetite is good and he eats quite a bit, usually anything he wants. Has lost approx 30 pounds over the past few years though he went through a divorce and the loss of a child. Denies constipation or diarrhea. He does report that when the urge hits him to defecate, he has to get to a restroom pretty quickly, this is not new for him. Usually has a BM every morning between 5 and 530am. Denies rectal bleeding or melena. Reports very occasional issues with acid reflux. Denies dysphagia or odynophagia. Does not drink alcohol or take any OTC meds. No dysphagia or odynophagia.   NSAID use: no otc meds  Social hx: no etoh, smokes cigars Fam hx: adopted, unsure of family hx   Last Colonoscopy:never  Last Endoscopy:never  Past Medical History:  Diagnosis Date   Bradycardia    Thyroid disease     Past Surgical History:  Procedure Laterality Date   CHEST TUBE INSERTION      Current Outpatient Medications  Medication Sig  Dispense Refill   levothyroxine (SYNTHROID, LEVOTHROID) 125 MCG tablet Take 125 mcg by mouth daily before breakfast.      No current facility-administered medications for this visit.    Allergies as of 11/05/2021   (No Known Allergies)    History reviewed. No pertinent family history.  Social History   Socioeconomic History   Marital status: Divorced    Spouse name: Not on file   Number of children: Not on file   Years of education: Not on file   Highest education level: Not on file  Occupational History   Not on file  Tobacco Use   Smoking status: Some Days    Types: Cigars    Passive exposure: Current   Smokeless tobacco: Never  Substance and Sexual Activity   Alcohol use: No   Drug use: Yes    Types: Marijuana   Sexual activity: Not on file  Other Topics Concern   Not on file  Social History Narrative   Not on file   Social Determinants of Health   Financial Resource Strain: Not on file  Food Insecurity: Not on file  Transportation Needs: Not on file  Physical Activity: Not on file  Stress: Not on file  Social Connections: Not on file   Review of systems General: negative for malaise, night sweats, fever, chills, weight loss Neck: Negative for lumps, goiter, pain and significant neck swelling  Resp: Negative for cough, wheezing, dyspnea at rest CV: Negative for chest pain, leg swelling, palpitations, orthopnea GI: denies melena, hematochezia, nausea, vomiting, diarrhea, constipation, dysphagia, odyonophagia, early satiety or unintentional weight loss. +epigastric pain MSK: Negative for joint pain or swelling, back pain, and muscle pain. Derm: Negative for itching or rash Psych: Denies depression, anxiety, memory loss, confusion. No homicidal or suicidal ideation.  Heme: Negative for prolonged bleeding, bruising easily, and swollen nodes. Endocrine: Negative for cold or heat intolerance, polyuria, polydipsia and goiter. Neuro: negative for tremor, gait  imbalance, syncope and seizures. The remainder of the review of systems is noncontributory.  Physical Exam: BP 102/67 (BP Location: Left Arm, Patient Position: Sitting, Cuff Size: Normal)   Pulse 63   Temp 98.1 F (36.7 C) (Oral)   Ht 5\' 9"  (1.753 m)   Wt 130 lb (59 kg)   BMI 19.20 kg/m  General:   Alert and oriented. No distress noted. Pleasant and cooperative.  Head:  Normocephalic and atraumatic. Eyes:  Conjuctiva clear without scleral icterus. Mouth:  Oral mucosa pink and moist. Good dentition. No lesions. Heart: Normal rate and rhythm, s1 and s2 heart sounds present.  Lungs: Clear lung sounds in all lobes. Respirations equal and unlabored. Abdomen:  +BS, soft, non-tender and non-distended. No rebound or guarding. No HSM or masses noted. Derm: No palmar erythema or jaundice Msk:  Symmetrical without gross deformities. Normal posture. Extremities:  Without edema. Neurologic:  Alert and  oriented x4 Psych:  Alert and cooperative. Normal mood and affect.  Invalid input(s): 6 MONTHS   ASSESSMENT: HOSS PALERMO is a 56 y.o. male presenting today as a new patient for epigastric pain.  Epigastric pain x6 months without precipitating or alleviating factors. Denies nausea, vomiting, early satiety, changes in appetite or associated weight loss. No radiation of pain. Denies GERD symptoms. [place] abd Complete and CT A/P previously without any findings to explain symptoms. No NSAID or ETOH use. Discussed proceeding with EGD for further evaluation as I cannot rule out PUD, gastritis, esophagitis or less likely, malignancy. Indications, risks and benefits of procedure discussed in detail with patient. Patient verbalized understanding and is in agreement to proceed with EGD at this time. Patient declined the need for anything for pain at this time. He will make me aware if he feels that he requires something, if so will send dicyclomine.   No previous colonoscopy, I discussed indications for CRC  screening via colonoscopy, starting at age 6 with the patient. He is of average risk for CRC. States he has done FIT test with PCP. Will think about colonoscopy and let me know if he wishes to proceed with this in the future.   PLAN:  Schedule EGD-endo 1 2.  Pt to consider CRC screening via colonoscopy  3. Will make me aware of new or worsening GI symptoms  All questions were answered, patient verbalized understanding and is in agreement with plan as outlined above.   Follow Up: 4 months  Johathan Province L. Alver Sorrow, MSN, APRN, AGNP-C Adult-Gerontology Nurse Practitioner [facility]

## 2021-11-05 NOTE — Patient Instructions (Signed)
We will get you scheduled for upper endoscopy for further evaluation of your symptoms. Please consider colonoscopy for colon cancer screening. If pain becomes worse or you have any new associated symptoms, please make me aware.  Follow up 4 months

## 2021-11-06 ENCOUNTER — Other Ambulatory Visit (INDEPENDENT_AMBULATORY_CARE_PROVIDER_SITE_OTHER): Payer: Self-pay

## 2021-11-06 DIAGNOSIS — R1013 Epigastric pain: Secondary | ICD-10-CM

## 2021-11-07 ENCOUNTER — Encounter (INDEPENDENT_AMBULATORY_CARE_PROVIDER_SITE_OTHER): Payer: Self-pay

## 2021-11-21 ENCOUNTER — Other Ambulatory Visit: Payer: Self-pay

## 2021-11-21 ENCOUNTER — Encounter (HOSPITAL_COMMUNITY): Payer: Self-pay

## 2021-11-21 ENCOUNTER — Encounter (HOSPITAL_COMMUNITY)
Admission: RE | Admit: 2021-11-21 | Discharge: 2021-11-21 | Disposition: A | Discharge status: Home / Self Care | Payer: PRIVATE HEALTH INSURANCE | Source: Ambulatory Visit | Attending: Gastroenterology | Admitting: Gastroenterology

## 2021-11-21 HISTORY — DX: Hypothyroidism, unspecified: E03.9

## 2021-11-21 NOTE — Pre-Procedure Instructions (Signed)
Attempted pre-op phone call. Left voicemail on [phone] for him to call us back.

## 2021-11-23 ENCOUNTER — Encounter (HOSPITAL_COMMUNITY): Payer: Self-pay | Admitting: Gastroenterology

## 2021-11-23 ENCOUNTER — Ambulatory Visit (HOSPITAL_COMMUNITY)
Admission: RE | Admit: 2021-11-23 | Discharge: 2021-11-23 | Disposition: A | Discharge status: Home / Self Care | Payer: PRIVATE HEALTH INSURANCE | Attending: Gastroenterology | Admitting: Gastroenterology

## 2021-11-23 ENCOUNTER — Encounter (HOSPITAL_COMMUNITY)
Admission: RE | Disposition: A | Discharge status: Home / Self Care | Payer: Self-pay | Source: Home / Self Care | Attending: Gastroenterology

## 2021-11-23 ENCOUNTER — Ambulatory Visit (HOSPITAL_COMMUNITY): Payer: PRIVATE HEALTH INSURANCE | Admitting: Anesthesiology

## 2021-11-23 ENCOUNTER — Other Ambulatory Visit: Payer: Self-pay

## 2021-11-23 ENCOUNTER — Ambulatory Visit (HOSPITAL_BASED_OUTPATIENT_CLINIC_OR_DEPARTMENT_OTHER): Payer: PRIVATE HEALTH INSURANCE | Admitting: Anesthesiology

## 2021-11-23 DIAGNOSIS — K3189 Other diseases of stomach and duodenum: Secondary | ICD-10-CM | POA: Insufficient documentation

## 2021-11-23 DIAGNOSIS — K2980 Duodenitis without bleeding: Secondary | ICD-10-CM

## 2021-11-23 DIAGNOSIS — K2950 Unspecified chronic gastritis without bleeding: Secondary | ICD-10-CM | POA: Insufficient documentation

## 2021-11-23 DIAGNOSIS — B9681 Helicobacter pylori [H. pylori] as the cause of diseases classified elsewhere: Secondary | ICD-10-CM | POA: Diagnosis not present

## 2021-11-23 DIAGNOSIS — F17290 Nicotine dependence, other tobacco product, uncomplicated: Secondary | ICD-10-CM | POA: Diagnosis not present

## 2021-11-23 DIAGNOSIS — K2289 Other specified disease of esophagus: Secondary | ICD-10-CM

## 2021-11-23 DIAGNOSIS — Z681 Body mass index (BMI) 19 or less, adult: Secondary | ICD-10-CM | POA: Insufficient documentation

## 2021-11-23 DIAGNOSIS — K219 Gastro-esophageal reflux disease without esophagitis: Secondary | ICD-10-CM | POA: Diagnosis not present

## 2021-11-23 DIAGNOSIS — E039 Hypothyroidism, unspecified: Secondary | ICD-10-CM | POA: Insufficient documentation

## 2021-11-23 DIAGNOSIS — R634 Abnormal weight loss: Secondary | ICD-10-CM | POA: Insufficient documentation

## 2021-11-23 DIAGNOSIS — R1013 Epigastric pain: Secondary | ICD-10-CM | POA: Diagnosis not present

## 2021-11-23 HISTORY — PX: ESOPHAGOGASTRODUODENOSCOPY (EGD) WITH PROPOFOL: SHX5813

## 2021-11-23 HISTORY — PX: BIOPSY: SHX5522

## 2021-11-23 SURGERY — ESOPHAGOGASTRODUODENOSCOPY (EGD) WITH PROPOFOL
Anesthesia: General

## 2021-11-23 MED ORDER — GLYCOPYRROLATE PF 0.2 MG/ML IJ SOSY
PREFILLED_SYRINGE | INTRAMUSCULAR | Status: AC
  Filled 2021-11-23: qty 1

## 2021-11-23 MED ORDER — LACTATED RINGERS IV SOLN
INTRAVENOUS | Status: DC
Start: 2021-11-23 — End: 2021-11-23

## 2021-11-23 MED ORDER — PROPOFOL 10 MG/ML IV BOLUS
INTRAVENOUS | Status: DC | PRN
  Administered 2021-11-23 (×2): 50 mg via INTRAVENOUS
  Administered 2021-11-23: 100 mg via INTRAVENOUS

## 2021-11-23 MED ORDER — OMEPRAZOLE 40 MG PO CPDR: 40.0000 mg | DELAYED_RELEASE_CAPSULE | Freq: Every day | ORAL | 3 refills | Status: AC

## 2021-11-23 MED ORDER — GLYCOPYRROLATE 0.2 MG/ML IJ SOLN
INTRAMUSCULAR | Status: DC | PRN
  Administered 2021-11-23: .2 mg via INTRAVENOUS

## 2021-11-23 NOTE — Transfer of Care (Signed)
Immediate Anesthesia Transfer of Care Note  Patient: Despina Arias  Procedure(s) Performed: ESOPHAGOGASTRODUODENOSCOPY (EGD) WITH PROPOFOL BIOPSY  Patient Location: Short Stay  Anesthesia Type:MAC  Level of Consciousness: awake and patient cooperative  Airway & Oxygen Therapy: Patient Spontanous Breathing  Post-op Assessment: Report given to RN and Post -op Vital signs reviewed and stable  Post vital signs: Reviewed and stable  Last Vitals:  Vitals Value Taken Time  BP 119/61 11/23/21 0934  Temp 36.4 C 11/23/21 0934  Pulse 49 11/23/21 0934  Resp 15 11/23/21 0934  SpO2 97 % 11/23/21 0934    Last Pain:  Vitals:   11/23/21 0934  TempSrc: Axillary  PainSc: 0-No pain      Patients Stated Pain Goal: 8 (36/01/65 8006)  Complications: No notable events documented.

## 2021-11-23 NOTE — Anesthesia Preprocedure Evaluation (Signed)
Anesthesia Evaluation  ?Patient identified by MRN, date of birth, ID band ?Patient awake ? ? ? ?Reviewed: ?Allergy & Precautions, H&P , NPO status , Patient's Chart, lab work & pertinent test results, reviewed documented beta blocker date and time  ? ?Airway ?Mallampati: II ? ?TM Distance: >3 FB ?Neck ROM: full ? ? ? Dental ?no notable dental hx. ? ?  ?Pulmonary ?neg pulmonary ROS, Current Smoker and Patient abstained from smoking.,  ?  ?Pulmonary exam normal ?breath sounds clear to auscultation ? ? ? ? ? ? Cardiovascular ?Exercise Tolerance: Good ?negative cardio ROS ? ? ?Rhythm:regular Rate:Normal ? ? ?  ?Neuro/Psych ?negative neurological ROS ? negative psych ROS  ? GI/Hepatic ?negative GI ROS, Neg liver ROS,   ?Endo/Other  ?Hypothyroidism  ? Renal/GU ?negative Renal ROS  ?negative genitourinary ?  ?Musculoskeletal ? ? Abdominal ?  ?Peds ? Hematology ?negative hematology ROS ?(+)   ?Anesthesia Other Findings ? ? Reproductive/Obstetrics ?negative OB ROS ? ?  ? ? ? ? ? ? ? ? ? ? ? ? ? ?  ?  ? ? ? ? ? ? ? ? ?Anesthesia Physical ?Anesthesia Plan ? ?ASA: 2 ? ?Anesthesia Plan: General  ? ?Post-op Pain Management:   ? ?Induction:  ? ?PONV Risk Score and Plan: Propofol infusion ? ?Airway Management Planned:  ? ?Additional Equipment:  ? ?Intra-op Plan:  ? ?Post-operative Plan:  ? ?Informed Consent: I have reviewed the patients History and Physical, chart, labs and discussed the procedure including the risks, benefits and alternatives for the proposed anesthesia with the patient or authorized representative who has indicated his/her understanding and acceptance.  ? ? ? ?Dental Advisory Given ? ?Plan Discussed with: CRNA ? ?Anesthesia Plan Comments:   ? ? ? ? ? ? ?Anesthesia Quick Evaluation ? ?

## 2021-11-23 NOTE — Op Note (Addendum)
Timothy Irwin [facility] Patient Name: Timothy CooterWilliam Irwin Procedure Date: 11/23/2021 9:00 AM MRN: [id number] Date of Birth: 1965/11/28 Attending MD: Katrinka Blazinganiel Castaneda ,  CSN: [id number] Age: [age] Admit Type: Outpatient Procedure:                Upper GI endoscopy Indications:              Epigastric abdominal pain, Weight loss Providers:                Katrinka Blazinganiel Castaneda, Nena PolioLisa Moore, RN, Cyril MourningJay Christopher                            Tech, Technician Referring MD:              Medicines:                Monitored Anesthesia Care Complications:            No immediate complications. Estimated Blood Loss:     Estimated blood loss: none. Procedure:                Pre-Anesthesia Assessment:                           - Prior to the procedure, a History and Physical                            was performed, and patient medications, allergies                            and sensitivities were reviewed. The patient's                            tolerance of previous anesthesia was reviewed.                           - The risks and benefits of the procedure and the                            sedation options and risks were discussed with the                            patient. All questions were answered and informed                            consent was obtained.                           - ASA Grade Assessment: II - A patient with mild                            systemic disease.                           After obtaining informed consent, the endoscope was                            passed under direct vision. Throughout the  procedure, the patient's blood pressure, pulse, and                            oxygen saturations were monitored continuously. The                            [id number]-H190 ([id number]) scope was introduced through the                            mouth, and advanced to the second part of duodenum.                            The upper GI endoscopy was accomplished without                             difficulty. The patient tolerated the procedure                            well. Scope In: 9:20:47 AM Scope Out: 9:30:11 AM Total Procedure Duration: 0 hours 9 minutes 24 seconds  Findings:      The upper third of the esophagus and middle third of the esophagus were       normal.      Scattered islands of salmon-colored mucosa were present at 42 cm. No       other visible abnormalities were present. Biopsies were taken with a       cold forceps for histology.      A few localized diminutive erosions with no stigmata of recent bleeding       were found in the gastric antrum. Biopsies were taken with a cold       forceps for Helicobacter pylori testing.      Diffuse moderate inflammation characterized by congestion (edema),       erosions and erythema was found in the first portion of the duodenum.      The exam of the duodenum was otherwise normal. Impression:               - Normal upper third of esophagus and middle third                            of esophagus.                           - Salmon-colored mucosa. Biopsied.                           - Erosive gastropathy with no stigmata of recent                            bleeding. Biopsied.                           - Duodenitis. Moderate Sedation:      Per Anesthesia Care Recommendation:           - Discharge patient to home (ambulatory).                           -  Resume previous diet.                           - Await pathology results.                           - Use Prilosec (omeprazole) 40 mg PO daily. Procedure Code(s):        --- Professional ---                           [phone], Esophagogastroduodenoscopy, flexible,                            transoral; with biopsy, single or multiple Diagnosis Code(s):        --- Professional ---                           K22.8, Other specified diseases of esophagus                           K31.89, Other diseases of stomach and duodenum                            K29.80, Duodenitis without bleeding                           R10.13, Epigastric pain                           R63.4, Abnormal weight loss CPT copyright 2019 American Medical Association. All rights reserved. The codes documented in this report are preliminary and upon coder review may  be revised to meet current compliance requirements. Katrinka Blazing, MD Katrinka Blazing,  11/23/2021 9:37:47 AM This report has been signed electronically. Number of Addenda: 0

## 2021-11-23 NOTE — Anesthesia Procedure Notes (Signed)
Date/Time: 11/23/2021 9:15 AM  Performed by: Franco Nones, CRNAPre-anesthesia Checklist: Patient identified, Emergency Drugs available, Suction available, Timeout performed and Patient being monitored Patient Re-evaluated:Patient Re-evaluated prior to induction Oxygen Delivery Method: Nasal Cannula

## 2021-11-23 NOTE — Anesthesia Postprocedure Evaluation (Signed)
Anesthesia Post Note  Patient: Timothy Irwin  Procedure(s) Performed: ESOPHAGOGASTRODUODENOSCOPY (EGD) WITH PROPOFOL BIOPSY  Patient location during evaluation: Phase II Anesthesia Type: General Level of consciousness: awake Pain management: pain level controlled Vital Signs Assessment: post-procedure vital signs reviewed and stable Respiratory status: spontaneous breathing and respiratory function stable Cardiovascular status: blood pressure returned to baseline and stable Postop Assessment: no headache and no apparent nausea or vomiting Anesthetic complications: no Comments: Late entry   No notable events documented.   Last Vitals:  Vitals:   11/23/21 0752 11/23/21 0934  BP: 121/68 119/61  Pulse: (!) 45 (!) 49  Resp: 14 15  Temp: (!) 36.4 C (!) 36.4 C  SpO2: 99% 97%    Last Pain:  Vitals:   11/23/21 0934  TempSrc: Axillary  PainSc: 0-No pain                 Windell Norfolk

## 2021-11-23 NOTE — Discharge Instructions (Signed)
You are being discharged to home.  Resume your previous diet.  We are waiting for your pathology results.  Take Prilosec (omeprazole) 40 mg by mouth once a day.  If normal biopsies, consider CT abdomen and pelvis with IV contrast.

## 2021-11-23 NOTE — Interval H&P Note (Signed)
History and Physical Interval Note:  11/23/2021 9:05 AM  Timothy Irwin  has presented today for surgery, with the diagnosis of Epigastric Pain.  The various methods of treatment have been discussed with the patient and family. After consideration of risks, benefits and other options for treatment, the patient has consented to  Procedure(s) with comments: ESOPHAGOGASTRODUODENOSCOPY (EGD) WITH PROPOFOL (N/A) - 900 ASA 1 as a surgical intervention.  The patient's history has been reviewed, patient examined, no change in status, stable for surgery.  I have reviewed the patient's chart and labs.  Questions were answered to the patient's satisfaction.     Katrinka Blazing Mayorga

## 2021-11-26 ENCOUNTER — Other Ambulatory Visit (INDEPENDENT_AMBULATORY_CARE_PROVIDER_SITE_OTHER): Payer: Self-pay | Admitting: Gastroenterology

## 2021-11-26 DIAGNOSIS — B9681 Helicobacter pylori [H. pylori] as the cause of diseases classified elsewhere: Secondary | ICD-10-CM

## 2021-11-26 LAB — SURGICAL PATHOLOGY

## 2021-11-26 MED ORDER — BISMUTH 262 MG PO CHEW: 2.0000 | CHEWABLE_TABLET | Freq: Four times a day (QID) | ORAL | 0 refills | Status: AC

## 2021-11-26 MED ORDER — METRONIDAZOLE 500 MG PO TABS: 500.0000 mg | ORAL_TABLET | Freq: Three times a day (TID) | ORAL | 0 refills | Status: AC

## 2021-11-26 MED ORDER — TETRACYCLINE HCL 500 MG PO CAPS: 500.0000 mg | ORAL_CAPSULE | Freq: Four times a day (QID) | ORAL | 0 refills | Status: AC

## 2021-11-26 NOTE — Progress Notes (Signed)
I advised the patient to take omeprazole twice a day for the next 2 weeks

## 2021-11-30 ENCOUNTER — Encounter (HOSPITAL_COMMUNITY): Payer: Self-pay | Admitting: Gastroenterology

## 2022-03-26 ENCOUNTER — Encounter (INDEPENDENT_AMBULATORY_CARE_PROVIDER_SITE_OTHER): Payer: Self-pay | Admitting: Gastroenterology

## 2022-03-26 ENCOUNTER — Ambulatory Visit (INDEPENDENT_AMBULATORY_CARE_PROVIDER_SITE_OTHER): Payer: PRIVATE HEALTH INSURANCE | Admitting: Gastroenterology
